# Patient Record
Sex: Male | Born: 1955 | Race: White | Hispanic: No | Marital: Married | Smoking: Never smoker
Health system: Southern US, Community
[De-identification: ages and names within clinical notes are randomized; demographics above are authoritative.]

---

## 2007-10-25 ENCOUNTER — Ambulatory Visit: Payer: Self-pay | Admitting: Internal Medicine

## 2009-05-23 IMAGING — CR DG CHEST 2V
1 series · 3 of 3 positions shown · non-contrast
Comparison: none

REASON FOR EXAM: RIGHT SIDED CHEST PAIN
COMMENTS:

PROCEDURE:     DXR - DXR CHEST PA (OR AP) AND LATERAL  - October 25, 2007 [DATE]
RESULT:     The lungs are clear. The cardiac silhouette and visualized bony
skeleton are unremarkable.

[Series 1: view not recorded · 0.17mm/px · 3 of 3 slices shown]
[im 1/3]
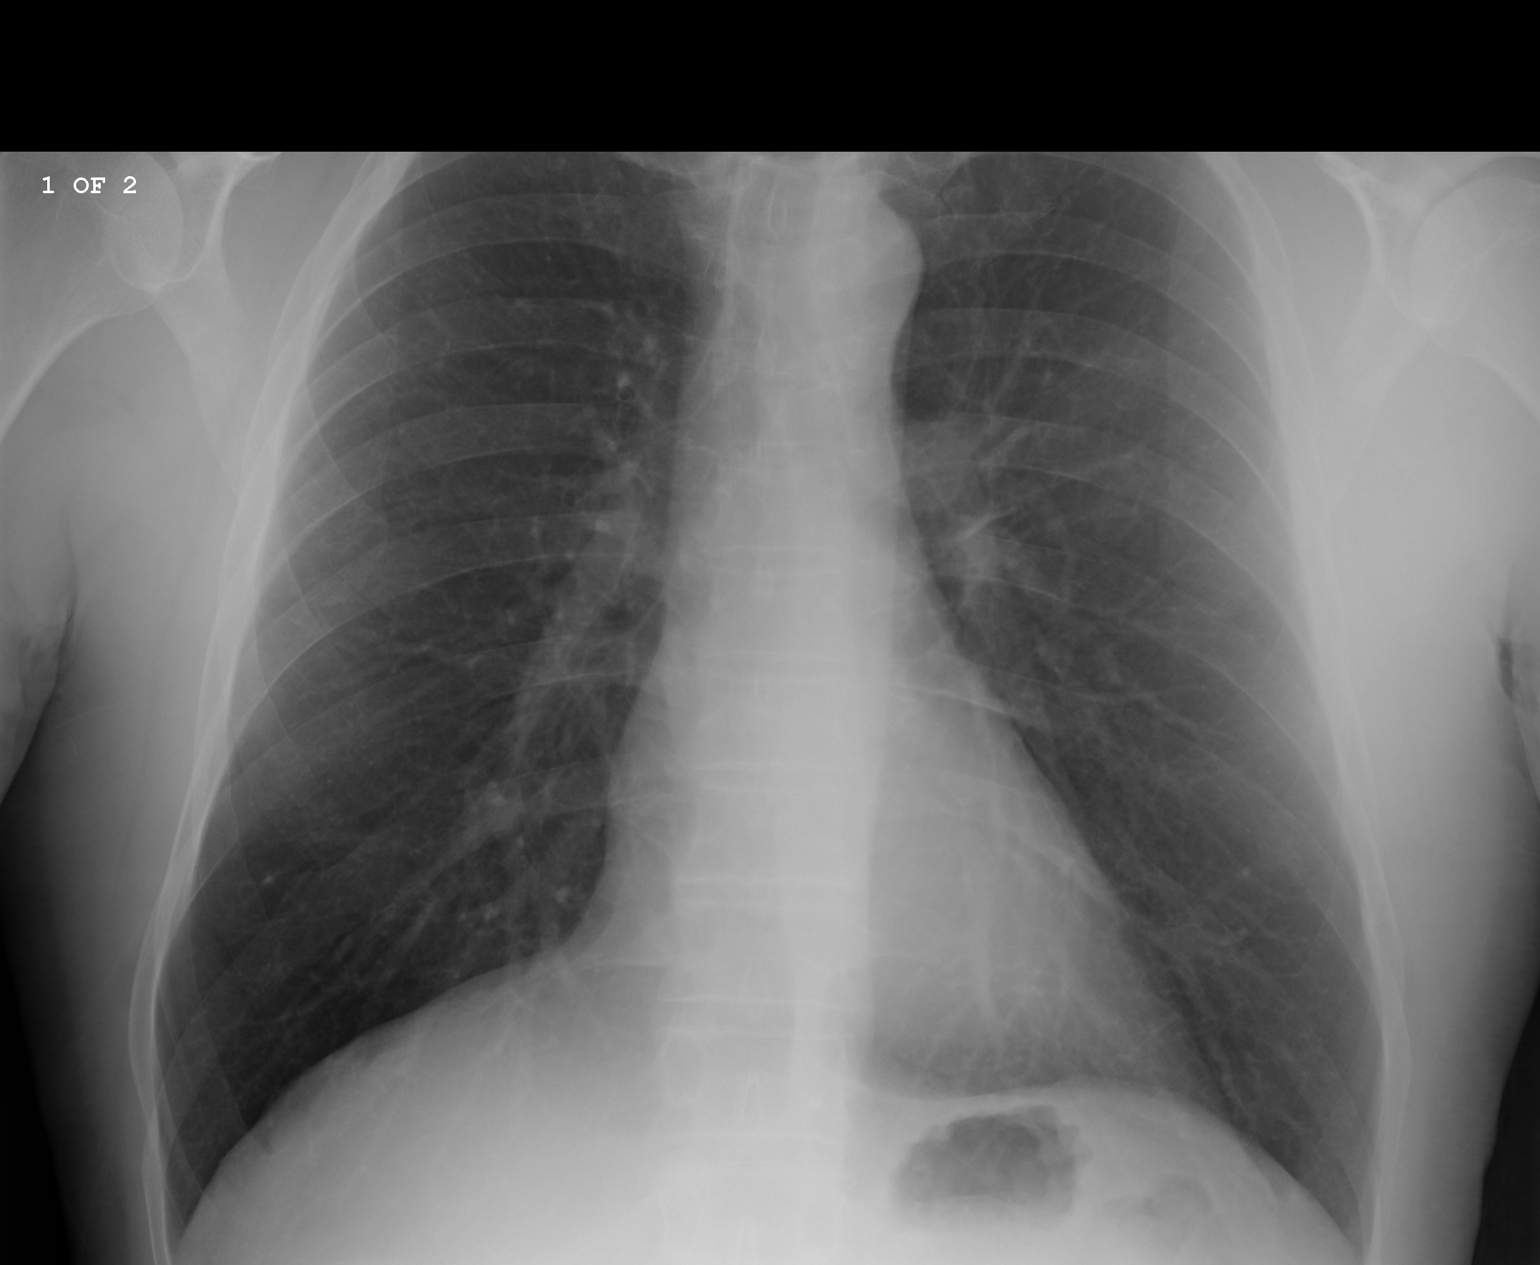
[im 2/3]
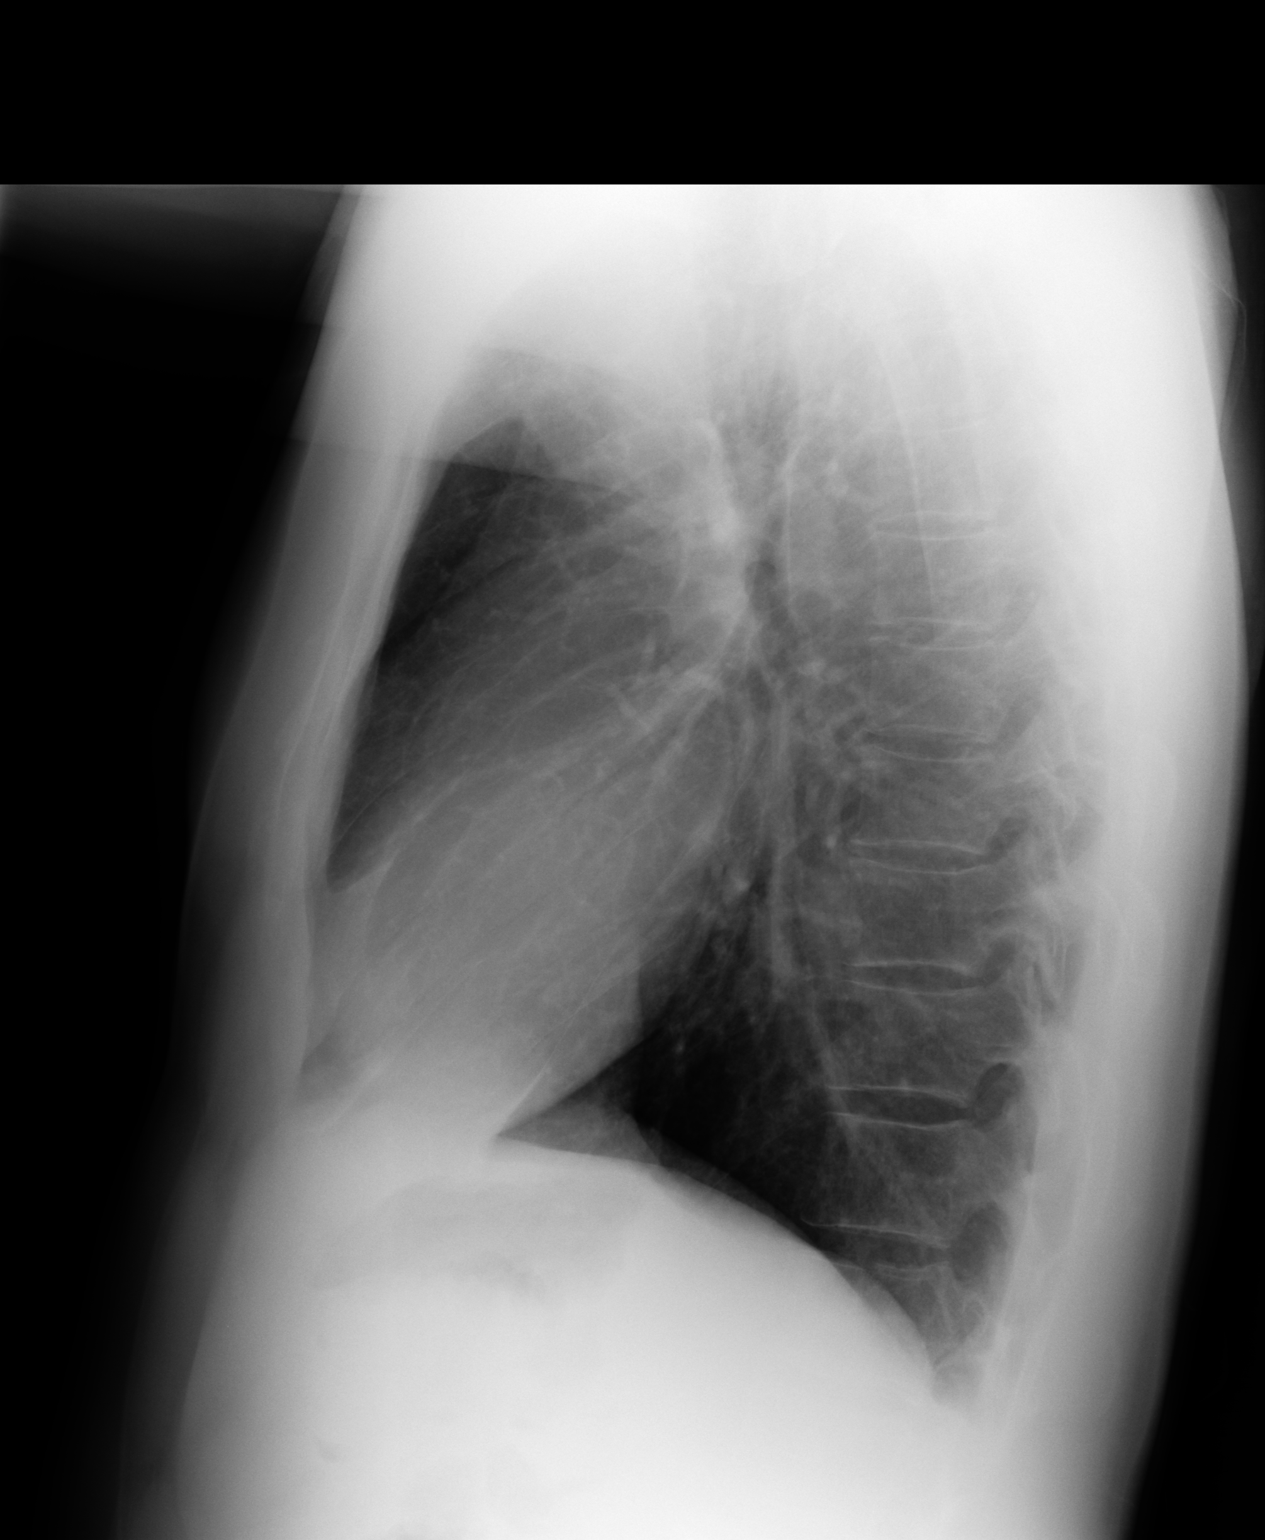
[im 3/3]
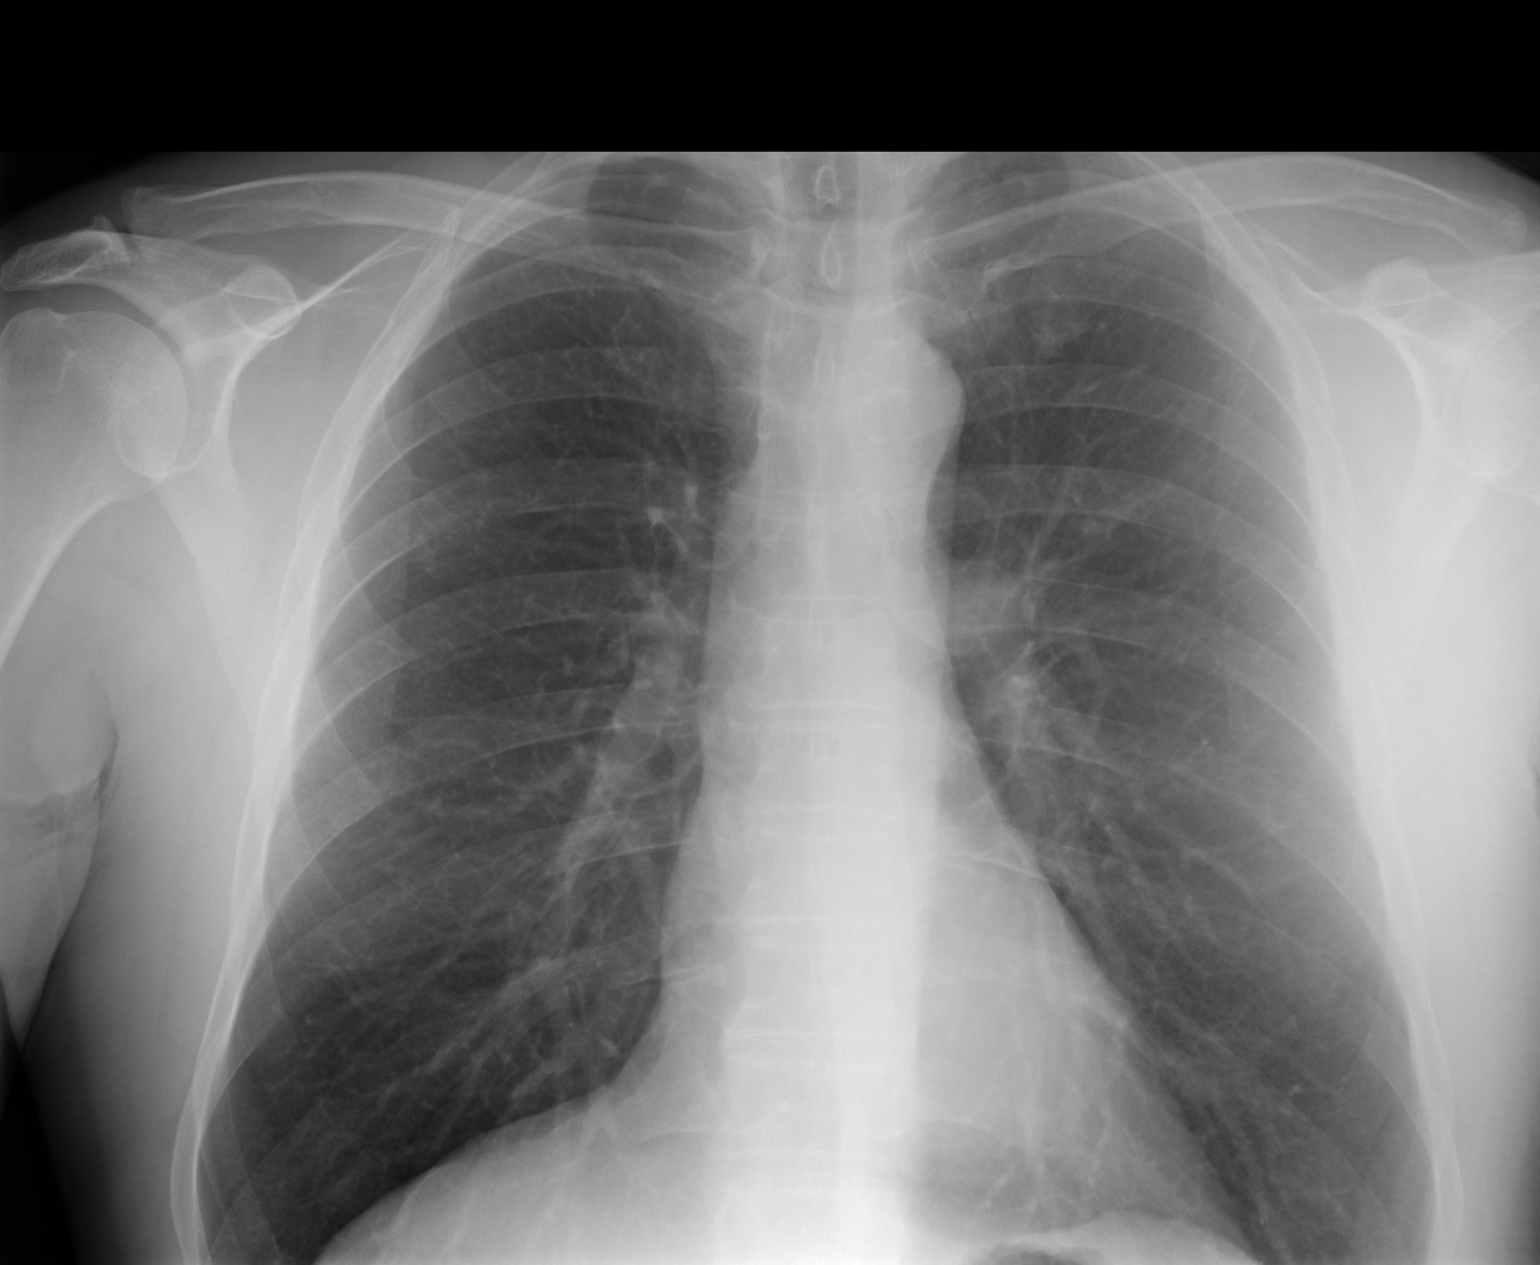

[3 of 3 positions shown; findings below may reference images not displayed]

IMPRESSION: Chest radiograph without evidence of acute cardiopulmonary
disease.

## 2018-12-05 ENCOUNTER — Encounter: Payer: Self-pay | Admitting: Urology

## 2018-12-05 ENCOUNTER — Ambulatory Visit (INDEPENDENT_AMBULATORY_CARE_PROVIDER_SITE_OTHER): Payer: Self-pay | Admitting: Urology

## 2018-12-05 VITALS — BP 147/81 | HR 58 | Ht 72.0 in | Wt 194.0 lb

## 2018-12-05 DIAGNOSIS — N401 Enlarged prostate with lower urinary tract symptoms: Secondary | ICD-10-CM

## 2018-12-05 NOTE — Progress Notes (Signed)
12/05/2018 8:52 PM   Christene Slates Ivor Messier 1955-12-17 [id number]  Referring provider: No referring provider defined for this encounter.  Chief Complaint  Patient presents with  . Elevated PSA    HPI: Paul Barr is a 63 year old male who presents for transfer of care.  He has been followed by Dr. Achilles Dunk for several years both in [place] and at [facility].  His [facility] records relate to an elevated PSA, BPH, family history prostate cancer and incomplete bladder emptying.  His PSA has never been significantly elevated and he has not had a prostate biopsy.  It apparently was increased on occasions and then returned to baseline.  He is PVRs have been minimal.  He has rare decreased force and caliber of his urinary stream and nocturia x2.  IPSS completed today was 3/2.  His last PSA was in February 2019 and was 2.0.  Denies dysuria, gross hematuria or flank/abdominal/pelvic/scrotal pain.    PMH: No past medical history on file.  Surgical History: History reviewed. No pertinent surgical history.  Home Medications:  Allergies as of 12/05/2018   Not on File     Medication List    as of December 05, 2018  8:52 PM   You have not been prescribed any medications.     Allergies: Allergies not on file  Family History: No family history on file.  Social History:  reports that he has never smoked. He has never used smokeless tobacco. He reports previous alcohol use. No history on file for drug.  ROS: UROLOGY Frequent Urination?: No Hard to postpone urination?: No Burning/pain with urination?: No Get up at night to urinate?: Yes Leakage of urine?: No Urine stream starts and stops?: No Trouble starting stream?: No Do you have to strain to urinate?: No Blood in urine?: No Urinary tract infection?: No Sexually transmitted disease?: No Injury to kidneys or bladder?: No Painful intercourse?: No Weak stream?: No Erection problems?: No Penile pain?: No  Gastrointestinal Nausea?:  No Vomiting?: No Indigestion/heartburn?: No Diarrhea?: No Constipation?: No  Constitutional Fever: No Night sweats?: No Weight loss?: No Fatigue?: No  Skin Skin rash/lesions?: No Itching?: No  Eyes Blurred vision?: No Double vision?: No  Ears/Nose/Throat Sore throat?: No Sinus problems?: No  Hematologic/Lymphatic Swollen glands?: No Easy bruising?: No  Cardiovascular Leg swelling?: No Chest pain?: No  Respiratory Cough?: No Shortness of breath?: No  Endocrine Excessive thirst?: No  Musculoskeletal Back pain?: Yes Joint pain?: No  Neurological Headaches?: No Dizziness?: No  Psychologic Depression?: No Anxiety?: No  Physical Exam: BP (!) 147/81   Pulse (!) 58   Ht 6' (1.829 m)   Wt 194 lb (88 kg)   BMI 26.31 kg/m   Constitutional:  Alert and oriented, No acute distress. HEENT: [place] AT, moist mucus membranes.  Trachea midline, no masses. Cardiovascular: No clubbing, cyanosis, or edema. Respiratory: Normal respiratory effort, no increased work of breathing. GI: Abdomen is soft, nontender, nondistended, no abdominal masses GU: No CVA tenderness.  Prostate 40 g, smooth without nodules Lymph: No cervical or inguinal lymphadenopathy. Skin: No rashes, bruises or suspicious lesions. Neurologic: Grossly intact, no focal deficits, moving all 4 extremities. Psychiatric: Normal mood and affect.  Urinalysis Dipstick/microscopy negative   Assessment & Plan:   63 year old male with BPH and mild lower urinary tract symptoms.  His voiding symptoms are not bothersome.  DRE today is benign.  A PSA was drawn today and he will be notified with results.  If stable he will continue annual follow-up.  Return  in about 1 year (around 12/05/2019) for PSA.   Riki Altes, MD  [facility] [address] [place], [place] [address] [phone]

## 2018-12-06 LAB — URINALYSIS, COMPLETE
Bilirubin, UA: NEGATIVE
GLUCOSE, UA: NEGATIVE
Ketones, UA: NEGATIVE
LEUKOCYTES UA: NEGATIVE
Nitrite, UA: NEGATIVE
Protein, UA: NEGATIVE
Specific Gravity, UA: 1.02 (ref 1.005–1.030)
UUROB: 1 mg/dL (ref 0.2–1.0)
pH, UA: 7 (ref 5.0–7.5)

## 2018-12-06 LAB — PSA: Prostate Specific Ag, Serum: 1.9 ng/mL (ref 0.0–4.0)

## 2018-12-08 ENCOUNTER — Encounter: Payer: Self-pay | Admitting: Urology

## 2019-12-11 ENCOUNTER — Ambulatory Visit: Payer: Self-pay | Admitting: Urology

## 2019-12-12 ENCOUNTER — Ambulatory Visit (INDEPENDENT_AMBULATORY_CARE_PROVIDER_SITE_OTHER): Payer: Self-pay | Admitting: Urology

## 2019-12-12 ENCOUNTER — Other Ambulatory Visit: Payer: Self-pay

## 2019-12-12 ENCOUNTER — Encounter: Payer: Self-pay | Admitting: Urology

## 2019-12-12 VITALS — BP 119/78 | HR 75 | Ht 72.0 in | Wt 195.0 lb

## 2019-12-12 DIAGNOSIS — N401 Enlarged prostate with lower urinary tract symptoms: Secondary | ICD-10-CM

## 2019-12-12 DIAGNOSIS — Z8042 Family history of malignant neoplasm of prostate: Secondary | ICD-10-CM

## 2019-12-12 NOTE — Progress Notes (Signed)
   12/12/2019 6:22 PM   Christene Slates Ivor Messier 1955-11-06 [id number]  Referring provider: No referring provider defined for this encounter.  Chief Complaint  Patient presents with  . Benign Prostatic Hypertrophy    HPI: 64 y.o. male presents for annual follow-up.  -Mild LUTS which are not bothersome -IPSS 4/35 -History of transient PSA elevation; no prior biopsy -Last PSA 3/20 was 1.9   PMH: No past medical history on file.  Surgical History: No past surgical history on file.  Home Medications:  Allergies as of 12/12/2019      Reactions   Codeine Itching      Medication List    as of December 12, 2019  6:22 PM   You have not been prescribed any medications.     Allergies:  Allergies  Allergen Reactions  . Codeine Itching    Family History: No family history on file.  Social History:  reports that he has never smoked. He has never used smokeless tobacco. He reports previous alcohol use. No history on file for drug.   Physical Exam: BP 119/78   Pulse 75   Ht 6' (1.829 m)   Wt 195 lb (88.5 kg)   BMI 26.45 kg/m   Constitutional:  Alert and oriented, No acute distress. HEENT: [place] AT, moist mucus membranes.  Trachea midline, no masses. Cardiovascular: No clubbing, cyanosis, or edema. Respiratory: Normal respiratory effort, no increased work of breathing. GU: Prostate 40 g, smooth without nodules   Assessment & Plan:    - Benign prostatic hyperplasia with lower urinary tract symptoms, symptom details unspecified Mild lower urinary tract symptoms which are not bothersome  - Family history prostate cancer PSA ordered today and if stable continue annual follow-up.   Riki Altes, MD  [facility] [address] [place], [place] [address] [phone]

## 2019-12-13 LAB — PSA: Prostate Specific Ag, Serum: 2.4 ng/mL (ref 0.0–4.0)

## 2019-12-14 ENCOUNTER — Telehealth: Payer: Self-pay | Admitting: *Deleted

## 2019-12-14 NOTE — Telephone Encounter (Signed)
-----   Message from Riki Altes, MD sent at 12/13/2019 10:10 PM EDT ----- PSA slightly higher than last year but within acceptable range.

## 2019-12-14 NOTE — Telephone Encounter (Signed)
Notified patient as instructed, patient pleased. Discussed follow-up appointments, patient agrees  

## 2020-12-03 ENCOUNTER — Other Ambulatory Visit: Payer: Self-pay

## 2020-12-03 DIAGNOSIS — N401 Enlarged prostate with lower urinary tract symptoms: Secondary | ICD-10-CM

## 2020-12-08 ENCOUNTER — Other Ambulatory Visit
Admission: RE | Admit: 2020-12-08 | Discharge: 2020-12-08 | Disposition: A | Discharge status: Home / Self Care | Payer: Medicare Other | Attending: Urology | Admitting: Urology

## 2020-12-08 ENCOUNTER — Encounter: Payer: Self-pay | Admitting: Urology

## 2020-12-08 ENCOUNTER — Other Ambulatory Visit: Payer: Self-pay

## 2020-12-08 DIAGNOSIS — N401 Enlarged prostate with lower urinary tract symptoms: Secondary | ICD-10-CM | POA: Insufficient documentation

## 2020-12-08 LAB — PSA: Prostatic Specific Antigen: 2.08 ng/mL (ref 0.00–4.00)

## 2020-12-08 NOTE — Addendum Note (Signed)
Addended by: Deloria Lair on: 12/08/2020 11:45 AM   Modules accepted: Orders

## 2020-12-11 ENCOUNTER — Encounter: Payer: Self-pay | Admitting: Urology

## 2020-12-11 ENCOUNTER — Ambulatory Visit: Payer: Medicare Other | Admitting: Urology

## 2020-12-11 ENCOUNTER — Other Ambulatory Visit: Payer: Self-pay

## 2020-12-11 VITALS — BP 132/81 | HR 64 | Ht 72.0 in | Wt 195.0 lb

## 2020-12-11 DIAGNOSIS — N401 Enlarged prostate with lower urinary tract symptoms: Secondary | ICD-10-CM | POA: Diagnosis not present

## 2020-12-11 DIAGNOSIS — Z8042 Family history of malignant neoplasm of prostate: Secondary | ICD-10-CM

## 2020-12-11 NOTE — Progress Notes (Signed)
   12/11/2020 2:25 PM   Paul Barr March 29, 1956 [id number]  Referring provider: No referring provider defined for this encounter.  Chief Complaint  Patient presents with  . Benign Prostatic Hypertrophy    HPI: 65 y.o. male presents for annual follow-up.   Mild LUTS which are not bothersome  Variable nocturia 0-2  History of transient PSA elevation without prior biopsy  PSA 12/08/2020 stable at 2.08  Denies dysuria or gross hematuria  Denies flank, abdominal or pelvic pain   PMH: History reviewed. No pertinent past medical history.  Surgical History: History reviewed. No pertinent surgical history.  Home Medications:  Allergies as of 12/11/2020      Reactions   Codeine Itching      Medication List    as of December 11, 2020  2:25 PM   You have not been prescribed any medications.     Allergies:  Allergies  Allergen Reactions  . Codeine Itching    Family History: History reviewed. No pertinent family history.  Social History:  reports that he has never smoked. He has never used smokeless tobacco. He reports previous alcohol use. No history on file for drug use.   Physical Exam: BP 132/81   Pulse 64   Ht 6' (1.829 m)   Wt 195 lb (88.5 kg)   BMI 26.45 kg/m   Constitutional:  Alert and oriented, No acute distress. HEENT: [place] AT, moist mucus membranes.  Trachea midline, no masses. Cardiovascular: No clubbing, cyanosis, or edema. Respiratory: Normal respiratory effort, no increased work of breathing. GU: Prostate 40 g, smooth without nodules Neurologic: Grossly intact, no focal deficits, moving all 4 extremities. Psychiatric: Normal mood and affect.   Assessment & Plan:    1.  BPH with mild LUTS  Stable  2.  Family history prostate cancer  Stable PSA  Benign DRE  Continue annual follow-up    Riki Altes, MD  [facility] [address] [place], [place] [address] [phone]

## 2021-11-17 ENCOUNTER — Other Ambulatory Visit: Payer: Self-pay | Admitting: *Deleted

## 2021-11-17 DIAGNOSIS — N401 Enlarged prostate with lower urinary tract symptoms: Secondary | ICD-10-CM

## 2021-12-07 ENCOUNTER — Other Ambulatory Visit: Payer: Medicare Other

## 2021-12-07 ENCOUNTER — Other Ambulatory Visit: Payer: Self-pay

## 2021-12-07 DIAGNOSIS — N401 Enlarged prostate with lower urinary tract symptoms: Secondary | ICD-10-CM

## 2021-12-08 LAB — PSA: Prostate Specific Ag, Serum: 2.1 ng/mL (ref 0.0–4.0)

## 2021-12-11 ENCOUNTER — Encounter: Payer: Self-pay | Admitting: Urology

## 2021-12-11 ENCOUNTER — Other Ambulatory Visit: Payer: Self-pay

## 2021-12-11 ENCOUNTER — Ambulatory Visit: Payer: Medicare Other | Admitting: Urology

## 2021-12-11 VITALS — BP 152/75 | HR 70 | Ht 72.0 in | Wt 198.0 lb

## 2021-12-11 DIAGNOSIS — Z8042 Family history of malignant neoplasm of prostate: Secondary | ICD-10-CM | POA: Diagnosis not present

## 2021-12-11 DIAGNOSIS — N5201 Erectile dysfunction due to arterial insufficiency: Secondary | ICD-10-CM

## 2021-12-11 DIAGNOSIS — R5383 Other fatigue: Secondary | ICD-10-CM

## 2021-12-11 DIAGNOSIS — N401 Enlarged prostate with lower urinary tract symptoms: Secondary | ICD-10-CM | POA: Diagnosis not present

## 2021-12-11 MED ORDER — TADALAFIL 20 MG PO TABS: 20.0000 mg | ORAL_TABLET | Freq: Every day | ORAL | 0 refills | Status: DC | PRN

## 2021-12-11 NOTE — Progress Notes (Signed)
? ?  12/11/2021 ?1:07 PM  ? ?Paul Barr ?10-25-1955 ?[id number] ? ?Referring provider: No referring provider defined for this encounter. ? ?Chief Complaint  ?Patient presents with  ? Elevated PSA  ? ? ?HPI: ?66 y.o. male presents for annual follow-up. ? ?Mild LUTS which are not bothersome ?Variable nocturia 0-2 ?History of transient PSA elevation without prior biopsy ?PSA 12/07/2021 stable at 2.1 ?Denies dysuria or gross hematuria ?Denies flank, abdominal or pelvic pain ?Since last years visit has noted difficulty achieving and maintaining an erection and decreased strength and energy ?He would be interested in medical management ?Risk factors prior tobacco history ? ? ?PMH: ?No past medical history on file. ? ?Surgical History: ?No past surgical history on file. ? ?Home Medications:  ?Allergies as of 12/11/2021   ? ?   Reactions  ? Codeine Itching  ? ?  ? ?  ?Medication List  ?  ?as of December 11, 2021  1:07 PM ?  ?You have not been prescribed any medications. ?  ? ? ?Allergies:  ?Allergies  ?Allergen Reactions  ? Codeine Itching  ? ? ?Family History: ?No family history on file. ? ?Social History:  reports that he has never smoked. He has never used smokeless tobacco. He reports that he does not currently use alcohol. No history on file for drug use. ? ? ?Physical Exam: ?BP (!) 152/75   Pulse 70   Ht 6' (1.829 m)   Wt 198 lb (89.8 kg)   BMI 26.85 kg/m?   ?Constitutional:  Alert and oriented, No acute distress. ?HEENT: [place] AT, moist mucus membranes.  Trachea midline, no masses. ?Cardiovascular: No clubbing, cyanosis, or edema. ?Respiratory: Normal respiratory effort, no increased work of breathing. ?GU: Prostate 40 g, smooth without nodules ?Neurologic: Grossly intact, no focal deficits, moving all 4 extremities. ?Psychiatric: Normal mood and affect. ? ? ?Assessment & Plan:   ? ?1.  BPH with mild LUTS ?Stable ? ?2.  Family history prostate cancer ?Stable PSA ?Benign DRE ? ?3.  Erectile dysfunction ?New  problem ?He was interested in a PDE 5 inhibitor trial ?Rx tadalafil 20 mg sent ? ?4.  Decreased energy/strength ?New problem ?Schedule lab visit for a.m. testosterone level ? ? ?Continue annual follow-up ? ? ? ?Riki Altes, MD ? ?[facility] ?[address], [address] ?[place], [place] [address] ?[phone] ? ?

## 2021-12-15 ENCOUNTER — Other Ambulatory Visit: Payer: Medicare Other

## 2021-12-15 ENCOUNTER — Other Ambulatory Visit: Payer: Self-pay

## 2021-12-15 DIAGNOSIS — N401 Enlarged prostate with lower urinary tract symptoms: Secondary | ICD-10-CM

## 2021-12-16 LAB — TESTOSTERONE: Testosterone: 670 ng/dL (ref 264–916)

## 2021-12-17 ENCOUNTER — Encounter: Payer: Self-pay | Admitting: Urology

## 2022-03-24 ENCOUNTER — Telehealth: Payer: Self-pay

## 2022-03-24 MED ORDER — TADALAFIL 20 MG PO TABS: ORAL_TABLET | ORAL | 3 refills | Status: DC

## 2022-03-24 NOTE — Addendum Note (Signed)
Addended by: Riki Altes on: 03/24/2022 12:41 PM   Modules accepted: Orders

## 2022-03-24 NOTE — Telephone Encounter (Signed)
Pt left message asking for refill on cialis

## 2022-03-25 ENCOUNTER — Other Ambulatory Visit: Payer: Self-pay | Admitting: *Deleted

## 2022-03-25 MED ORDER — TADALAFIL 20 MG PO TABS: 20.0000 mg | ORAL_TABLET | Freq: Every day | ORAL | 3 refills | Status: DC | PRN

## 2022-12-09 ENCOUNTER — Other Ambulatory Visit: Payer: Medicare Other

## 2022-12-09 DIAGNOSIS — N401 Enlarged prostate with lower urinary tract symptoms: Secondary | ICD-10-CM

## 2022-12-10 LAB — PSA: Prostate Specific Ag, Serum: 2.4 ng/mL (ref 0.0–4.0)

## 2022-12-13 ENCOUNTER — Ambulatory Visit: Payer: Medicare Other | Admitting: Urology

## 2022-12-13 ENCOUNTER — Encounter: Payer: Self-pay | Admitting: Urology

## 2022-12-13 VITALS — BP 115/68 | HR 66 | Ht 72.0 in | Wt 202.0 lb

## 2022-12-13 DIAGNOSIS — N401 Enlarged prostate with lower urinary tract symptoms: Secondary | ICD-10-CM

## 2022-12-13 DIAGNOSIS — N5201 Erectile dysfunction due to arterial insufficiency: Secondary | ICD-10-CM | POA: Diagnosis not present

## 2022-12-13 DIAGNOSIS — Z8042 Family history of malignant neoplasm of prostate: Secondary | ICD-10-CM

## 2022-12-13 NOTE — Progress Notes (Signed)
   12/13/2022 10:52 AM   Paul Barr Peter Garter [birth year] [id number]  Referring provider: No referring provider defined for this encounter.  Chief Complaint  Patient presents with   Other    PSA    HPI: 67 y.o. male presents for annual follow-up.  Stable LUTS; nocturia 1 time per night on average History of transient PSA elevation without prior biopsy PSA 12/09/2022 stable at 2.4 Denies dysuria or gross hematuria Denies flank, abdominal or pelvic pain At last years visit complained of ED and given a trial of tadalafil which has been effective.  Testosterone level was normal at 670  PMH: No past medical history on file.  Surgical History: No past surgical history on file.  Home Medications:  Allergies as of 12/13/2022       Reactions   Codeine Itching        Medication List        Accurate as of December 13, 2022 10:52 AM. If you have any questions, ask your nurse or doctor.          tadalafil 20 MG tablet Commonly known as: CIALIS Take 1 tablet (20 mg total) by mouth daily as needed for erectile dysfunction.        Allergies:  Allergies  Allergen Reactions   Codeine Itching    Family History: No family history on file.  Social History:  reports that he has never smoked. He has never used smokeless tobacco. He reports that he does not currently use alcohol. No history on file for drug use.   Physical Exam: BP 115/68   Pulse 66   Ht 6' (1.829 m)   Wt 202 lb (91.6 kg)   BMI 27.40 kg/m   Constitutional:  Alert and oriented, No acute distress. HEENT: [place] AT, moist mucus membranes.  Trachea midline, no masses. Cardiovascular: No clubbing, cyanosis, or edema. Respiratory: Normal respiratory effort, no increased work of breathing. GU: Prostate 40 g, smooth without nodules Neurologic: Grossly intact, no focal deficits, moving all 4 extremities. Psychiatric: Normal mood and affect.   Assessment & Plan:    1.  BPH with mild LUTS Stable  2.  Family  history prostate cancer Stable PSA Benign DRE  3.  Erectile dysfunction Stable on tadalafil which has been effective He did not need refills    Continue annual follow-up with [place], MD  [facility] [address] [place], [place] [address] [phone]

## 2023-08-04 ENCOUNTER — Other Ambulatory Visit: Payer: Self-pay | Admitting: *Deleted

## 2023-08-04 ENCOUNTER — Telehealth: Payer: Self-pay | Admitting: Urology

## 2023-08-04 MED ORDER — TADALAFIL 20 MG PO TABS: 20.0000 mg | ORAL_TABLET | Freq: Every day | ORAL | 3 refills | Status: DC | PRN

## 2023-08-04 NOTE — Telephone Encounter (Signed)
Medication sent.

## 2023-08-04 NOTE — Telephone Encounter (Signed)
Patient called to request refill for Tadalafil 20 mg. Pharmacy is [facility] in [place]. His prescription expired 03/25/23.

## 2023-12-07 ENCOUNTER — Other Ambulatory Visit: Payer: Self-pay

## 2023-12-07 DIAGNOSIS — N401 Enlarged prostate with lower urinary tract symptoms: Secondary | ICD-10-CM

## 2023-12-08 ENCOUNTER — Other Ambulatory Visit: Payer: Medicare Other

## 2023-12-08 ENCOUNTER — Other Ambulatory Visit: Payer: Medicare Other | Admitting: Urology

## 2023-12-08 DIAGNOSIS — N401 Enlarged prostate with lower urinary tract symptoms: Secondary | ICD-10-CM

## 2023-12-09 LAB — PSA: Prostate Specific Ag, Serum: 2.6 ng/mL (ref 0.0–4.0)

## 2023-12-14 ENCOUNTER — Encounter: Payer: Self-pay | Admitting: Urology

## 2023-12-14 ENCOUNTER — Ambulatory Visit: Payer: Medicare Other | Admitting: Urology

## 2023-12-14 VITALS — BP 112/72 | HR 76 | Ht 72.0 in | Wt 179.0 lb

## 2023-12-14 DIAGNOSIS — Z125 Encounter for screening for malignant neoplasm of prostate: Secondary | ICD-10-CM

## 2023-12-14 DIAGNOSIS — N5201 Erectile dysfunction due to arterial insufficiency: Secondary | ICD-10-CM

## 2023-12-14 DIAGNOSIS — N401 Enlarged prostate with lower urinary tract symptoms: Secondary | ICD-10-CM | POA: Diagnosis not present

## 2023-12-14 DIAGNOSIS — Z8042 Family history of malignant neoplasm of prostate: Secondary | ICD-10-CM

## 2023-12-14 MED ORDER — TADALAFIL 20 MG PO TABS: 20.0000 mg | ORAL_TABLET | Freq: Every day | ORAL | 3 refills | Status: DC | PRN

## 2023-12-14 NOTE — Progress Notes (Signed)
    I, Maysun Anabel Bene, acting as a scribe for Riki Altes, MD., have documented all relevant documentation on the behalf of Riki Altes, MD, as directed by  Riki Altes, MD while in the presence of Riki Altes, MD.  12/14/2023 12:45 PM   Christene Slates Ivor Messier 1955/10/12 [id number]  Chief Complaint  Patient presents with   Elevated PSA   Urologic history 1. BPH with LUTS Mild lower urinary tract symptoms. No medications  2. Erectile dysfunction Tadalafil 20 mg PRN.  HPI: Paul Barr is a 68 y.o. male presents for annual follow-up  No change in his low urinary tract symptoms, and they are not bothersome.  Started on tadalafil at last year's visit for ED, which he states has been effective.  Denies dysuria or gross hematuria Denies flank, abdominal or pelvic pain PSA 12/08/2023 stable at 2.6  PSA trend  Prostate Specific Ag, Serum  Latest Ref Rng 0.0 - 4.0 ng/mL  12/12/2019 2.4   12/07/2021 2.1   12/09/2022 2.4   12/08/2023 2.6       Home Medications:  Allergies as of 12/14/2023       Reactions   Codeine Itching        Medication List        Accurate as of December 14, 2023 12:45 PM. If you have any questions, ask your nurse or doctor.          tadalafil 20 MG tablet Commonly known as: CIALIS Take 1 tablet (20 mg total) by mouth daily as needed for erectile dysfunction.        Allergies:  Allergies  Allergen Reactions   Codeine Itching    Social History:  reports that he has never smoked. He has never used smokeless tobacco. He reports that he does not currently use alcohol. No history on file for drug use.   Physical Exam: BP 112/72   Pulse 76   Ht 6' (1.829 m)   Wt 179 lb (81.2 kg)   BMI 24.28 kg/m   Constitutional:  Alert and oriented, No acute distress. HEENT: [place] AT Respiratory: Normal respiratory effort, no increased work of breathing. Psychiatric: Normal mood and affect.   Assessment & Plan:    1. BPH with mild  LUTS Stable  2. Family history of prostate cancer Stable PSA He elected to have DRE every other year.   3. Erectile dysfunction Tadalafil 20 mg effective and was refilled Continue annual follow-up.  I have reviewed the above documentation for accuracy and completeness, and I agree with the above.   Riki Altes, MD  [facility] [address], [address] [place], [place] [address] [phone]

## 2024-10-15 ENCOUNTER — Other Ambulatory Visit: Payer: Self-pay | Admitting: Urology

## 2024-12-13 ENCOUNTER — Other Ambulatory Visit

## 2024-12-14 ENCOUNTER — Ambulatory Visit: Admitting: Urology
